# Patient Record
Sex: Male | Born: 2000 | Race: White | Hispanic: No | Marital: Single | Smoking: Current every day smoker
Health system: Southern US, Community
[De-identification: ages and names within clinical notes are randomized; demographics above are authoritative.]

## PROBLEM LIST (undated history)

## (undated) HISTORY — PX: FRACTURE SURGERY: SHX138

---
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
## 2021-01-21 DEATH — deceased
